# Patient Record
Sex: Female | Born: 1955 | Race: White | Hispanic: No | Marital: Married | Smoking: Current every day smoker
Health system: Southern US, Community
[De-identification: ages and names within clinical notes are randomized; demographics above are authoritative.]

## PROBLEM LIST (undated history)

## (undated) DIAGNOSIS — C499 Malignant neoplasm of connective and soft tissue, unspecified: Secondary | ICD-10-CM

## (undated) DIAGNOSIS — I10 Essential (primary) hypertension: Secondary | ICD-10-CM

---

## 2013-08-24 DIAGNOSIS — C499 Malignant neoplasm of connective and soft tissue, unspecified: Secondary | ICD-10-CM

## 2013-08-24 HISTORY — DX: Malignant neoplasm of connective and soft tissue, unspecified: C49.9

## 2014-08-24 HISTORY — PX: OTHER SURGICAL HISTORY: SHX169

## 2016-11-22 ENCOUNTER — Encounter (HOSPITAL_BASED_OUTPATIENT_CLINIC_OR_DEPARTMENT_OTHER): Payer: Self-pay | Admitting: *Deleted

## 2016-11-22 ENCOUNTER — Emergency Department (HOSPITAL_BASED_OUTPATIENT_CLINIC_OR_DEPARTMENT_OTHER)
Admission: EM | Admit: 2016-11-22 | Discharge: 2016-11-22 | Disposition: A | Discharge status: Home / Self Care | Payer: BC Managed Care – PPO | Attending: Emergency Medicine | Admitting: Emergency Medicine

## 2016-11-22 ENCOUNTER — Emergency Department (HOSPITAL_BASED_OUTPATIENT_CLINIC_OR_DEPARTMENT_OTHER): Payer: BC Managed Care – PPO

## 2016-11-22 DIAGNOSIS — S82401A Unspecified fracture of shaft of right fibula, initial encounter for closed fracture: Secondary | ICD-10-CM

## 2016-11-22 DIAGNOSIS — Y92009 Unspecified place in unspecified non-institutional (private) residence as the place of occurrence of the external cause: Secondary | ICD-10-CM | POA: Insufficient documentation

## 2016-11-22 DIAGNOSIS — S82201A Unspecified fracture of shaft of right tibia, initial encounter for closed fracture: Secondary | ICD-10-CM

## 2016-11-22 DIAGNOSIS — S82491A Other fracture of shaft of right fibula, initial encounter for closed fracture: Secondary | ICD-10-CM | POA: Insufficient documentation

## 2016-11-22 DIAGNOSIS — Y999 Unspecified external cause status: Secondary | ICD-10-CM | POA: Insufficient documentation

## 2016-11-22 DIAGNOSIS — S99911A Unspecified injury of right ankle, initial encounter: Secondary | ICD-10-CM | POA: Diagnosis present

## 2016-11-22 DIAGNOSIS — F17200 Nicotine dependence, unspecified, uncomplicated: Secondary | ICD-10-CM | POA: Diagnosis not present

## 2016-11-22 DIAGNOSIS — I10 Essential (primary) hypertension: Secondary | ICD-10-CM | POA: Insufficient documentation

## 2016-11-22 DIAGNOSIS — Y939 Activity, unspecified: Secondary | ICD-10-CM | POA: Diagnosis not present

## 2016-11-22 DIAGNOSIS — S82391A Other fracture of lower end of right tibia, initial encounter for closed fracture: Secondary | ICD-10-CM | POA: Insufficient documentation

## 2016-11-22 DIAGNOSIS — W1809XA Striking against other object with subsequent fall, initial encounter: Secondary | ICD-10-CM | POA: Diagnosis not present

## 2016-11-22 HISTORY — DX: Essential (primary) hypertension: I10

## 2016-11-22 HISTORY — DX: Malignant neoplasm of connective and soft tissue, unspecified: C49.9

## 2016-11-22 MED ORDER — HYDROMORPHONE HCL 1 MG/ML IJ SOLN
1.0000 mg | Freq: Once | INTRAMUSCULAR | Status: AC
  Administered 2016-11-22: 1 mg via INTRAMUSCULAR
  Filled 2016-11-22: qty 1

## 2016-11-22 MED ORDER — OXYCODONE-ACETAMINOPHEN 5-325 MG PO TABS: 1.0000 | ORAL_TABLET | ORAL | 0 refills | Status: AC | PRN

## 2016-11-22 NOTE — Discharge Instructions (Signed)
Keep your leg elevated above the level of your heart. Do not put any weight on your right leg. Get rechecked immediately if you develop significant worsening in your pain, numbness or increased pain and your foot.

## 2016-11-22 NOTE — ED Notes (Signed)
Call [facility] 

## 2016-11-22 NOTE — ED Provider Notes (Signed)
[facility] Provider Note   CSN: [id number] Arrival date & time: 11/22/16  1132     History   Chief Complaint Chief Complaint  Patient presents with  . Fall    HPI Ashley Salazar is a 61 y.o. female.  The history is provided by the patient. No language interpreter was used.  Fall     Ashley Salazar is a 61 y.o. female who presents to the Emergency Department complaining of leg pain.  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 She had a fall last night in her home when her leg buckled on her, which happens from time to time. She fell backwards, striking the back of her head lightly and landing on the floor. She reports severe pain to her right distal leg and ankle and has been unable to bear weight secondary to pain. She denies any headaches, vision changes, numbness, weakness, bowel pain, fevers, nausea, vomiting. 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 She does not take any blood thinners.  Past Medical History:  Diagnosis Date  . Hypertension   . Sarcoma ([place]) 2015   R leg    There are no active problems to display for this patient.   Past Surgical History:  Procedure Laterality Date  . OTHER SURGICAL HISTORY Right 2016   removal of femur, knee and quad muscles with rods placed d/t cancer    OB History    No data available       Home Medications    Prior to Admission medications   Medication Sig Start Date End Date Taking? Authorizing Provider  benazepril (LOTENSIN) 10 MG tablet Take 10 mg by mouth daily.   Yes Historical Provider, MD  METOPROLOL TARTRATE PO Take by mouth.   Yes Historical Provider, MD  SERTRALINE HCL PO Take by mouth.   Yes Historical Provider, MD  temazepam (RESTORIL) 30 MG capsule Take 30 mg by mouth at bedtime.   Yes Historical Provider, MD  oxyCODONE-acetaminophen (PERCOCET) 5-325 MG tablet Take 1 tablet by mouth  every 4 (four) hours as needed for severe pain. 11/22/16   Quintella Reichert, MD    Family History No family history on file.  Social History Social History  Substance Use Topics  . Smoking status: Current Every Day Smoker  . Smokeless tobacco: Never Used  . Alcohol use Yes     Comment: daily     Allergies   Patient has no known allergies.   Review of Systems Review of Systems  All other systems reviewed and are negative.    Physical Exam Updated Vital Signs BP (!) 163/80 (BP Location: Right Arm)   Pulse 83   Temp 97.6 F (36.4 C) (Oral)   Resp (!) 22   Ht 5\' 2"  (1.575 m)   Wt 145 lb (65.8 kg)   SpO2 100%   BMI 26.52 kg/m   Physical Exam  Constitutional: She is oriented to person, place, and time. She appears well-developed and well-nourished.  HENT:  Head: Normocephalic and atraumatic.  Cardiovascular: Normal rate and regular rhythm.   No murmur heard. Pulmonary/Chest: Effort normal and breath sounds normal. No respiratory distress.  Abdominal: Soft. There is no tenderness. There is no rebound and no guarding.  Musculoskeletal:  2+ DP pulses bilaterally. There is swelling and ecchymosis and tenderness to the right mid and distal leg with significant tenderness over bilateral  malleoli, right greater than left. There is no tenderness over the right knee or hip. There are surgical changes to the right thigh and knee. There is atrophy to the right thigh.  Neurological: She is alert and oriented to person, place, and time.  Sensation to light touch intact in bilateral lower extremities. Wiggles toes without difficulty. Strength testing is limited secondary to baseline deficit of absent quadriceps muscles as well as patient's pain.  Skin: Skin is warm and dry.  Psychiatric: She has a normal mood and affect. Her behavior is normal.  Nursing note and vitals reviewed.    ED Treatments / Results  Labs (all labs ordered are listed, but only abnormal results are  displayed) Labs Reviewed - No data to display  EKG  EKG Interpretation None       Radiology Dg Tibia/fibula Right  Result Date: 11/22/2016 CLINICAL DATA:  Status post fall.  Distal right lower leg pain. EXAM: RIGHT TIBIA AND FIBULA - 2 VIEW COMPARISON:  None. FINDINGS: Severe osteopenia. 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 Oblique fracture of the distal tibial diaphysis extending into the metaphysis with 7 mm of lateral displacement. Oblique fracture of the distal fibular diaphysis without significant displacement and minimal apex lateral angulation. Ankle mortise is intact. Soft tissue swelling around the distal right lower leg. IMPRESSION: Oblique fracture of the distal tibial diaphysis extending into the metaphysis with 7 mm of lateral displacement. Oblique fracture of the distal fibular diaphysis without significant displacement and minimal apex lateral angulation. Electronically Signed   By: Kathreen Devoid   On: 11/22/2016 12:54   Dg Ankle Complete Right  Result Date: 11/22/2016 CLINICAL DATA:  Status post fall.  Right lower leg pain. EXAM: RIGHT ANKLE - COMPLETE 3+ VIEW COMPARISON:  None. FINDINGS: Severe osteopenia. Oblique fracture of the distal tibial diaphysis extending into the metaphysis with 7 mm of lateral displacement. Oblique fracture of the distal fibular diaphysis without significant displacement and minimal apex lateral angulation. Ankle mortise is intact. Soft tissue swelling around the distal right lower leg. IMPRESSION: Oblique fracture of the distal tibial diaphysis extending into the metaphysis with 7 mm of lateral displacement. Oblique fracture of the distal fibular diaphysis without significant displacement and minimal apex lateral angulation. Electronically Signed   By: Kathreen Devoid   On: 11/22/2016 12:54    Procedures Procedures (including critical care time) SPLINT APPLICATION Date/Time: 3:53 PM Authorized by: Quintella Reichert Consent: Verbal  consent obtained. Risks and benefits: risks, benefits and alternatives were discussed Consent given by: patient Splint applied by: orthopedic technician Location details: RLE Splint type: posterior stirrup Supplies used: fiberglass, cotton batting Post-procedure: The splinted body part was neurovascularly unchanged following the procedure. Patient tolerance: Patient tolerated the procedure well with no immediate complications.    Medications Ordered in ED Medications  HYDROmorphone (DILAUDID) injection 1 mg (1 mg Intramuscular Given 11/22/16 1202)     Initial Impression / Assessment and Plan / ED Course  I have reviewed the triage vital signs and the nursing notes.  Pertinent labs & imaging results that were available during my care of the patient were reviewed by me and considered in my medical decision making (see chart for details).     Patient here for evaluation of injuries following a fall last night. She is neurovascularly intact on examination with swelling and tenderness to her distal leg. Imaging demonstrates a closed tib-fib fracture. Her pain was treated with Dilaudid in the emergency department. She was placed in a posterior  splint for immobilization. Discussed with Dr. Prudy Feeler, on-call physician for her orthopedic surgeon ,Dr. Redmond Pulling  at [facility]. Plan to discharge home with splint for immobilization with close evaluation in the office the next 24-48 hours. Counseled patient on home care of rest, elevation, pain control. Discussed very close return precautions for any evidence of worsening pain or developing compartment syndrome. Patient has a wheelchair available at home. We will provide crutches.  Final Clinical Impressions(s) / ED Diagnoses   Final diagnoses:  Closed fracture of right tibia and fibula, initial encounter    New Prescriptions New Prescriptions   OXYCODONE-ACETAMINOPHEN (PERCOCET) 5-325 MG TABLET    Take 1 tablet by mouth  every 4 (four) hours as needed for severe pain.     Quintella Reichert, MD 11/22/16 [phone]

## 2016-11-22 NOTE — ED Notes (Signed)
Pt teaching provided on medications that may cause drowsiness. Pt instructed not to drive or operate heavy machinery while taking the prescribed medication. Pt verbalized understanding.   

## 2016-11-22 NOTE — ED Triage Notes (Signed)
Pt reports she fell last night d/t her R leg giving out -- reports hardware in R leg d/t removal of bone and muscle d/t cancer (2016). Reports hitting the back of her head against the wall -- denies LOC, n/v. Denies other known injuries. Presents today with R leg pain.

## 2016-11-22 NOTE — ED Notes (Signed)
ED Provider at bedside. 

## 2016-11-22 NOTE — ED Notes (Signed)
Patient transported to X-ray 

## 2017-09-09 IMAGING — DX DG TIBIA/FIBULA 2V*R*
4 series · 4 of 4 positions shown · non-contrast
Comparison: None.

CLINICAL DATA: Status post fall.  Distal right lower leg pain.

EXAM:
RIGHT TIBIA AND FIBULA - 2 VIEW

[tibia ap (1 of 2)]
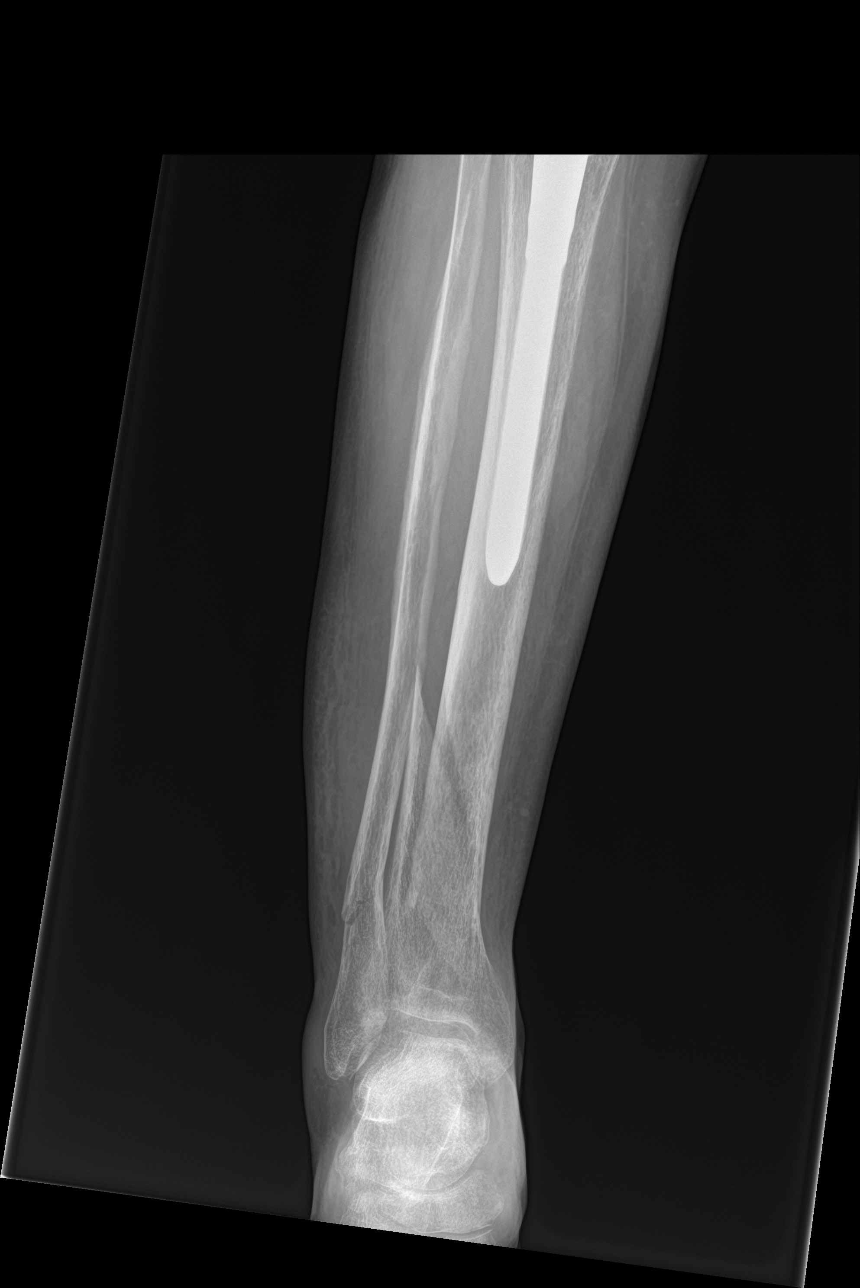

[tibia ap (2 of 2)]
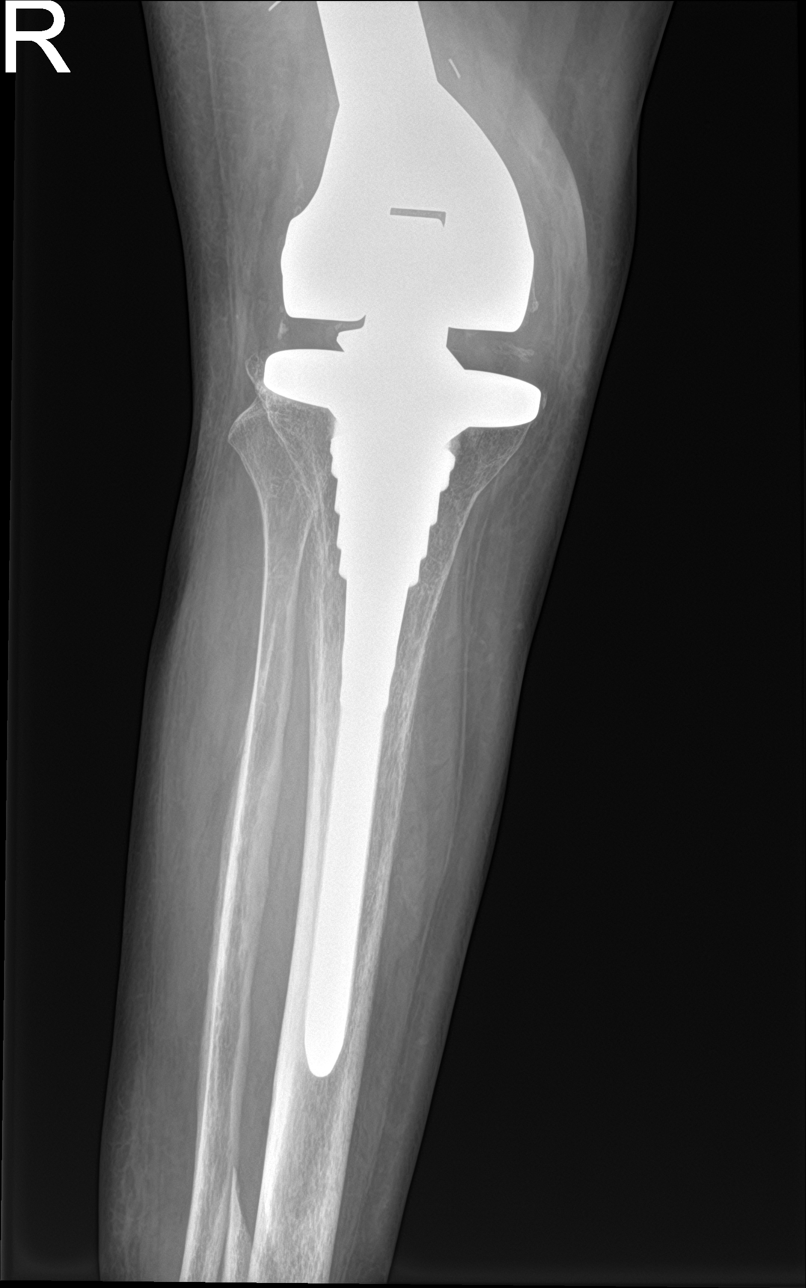

[tibia lat (1 of 2)]
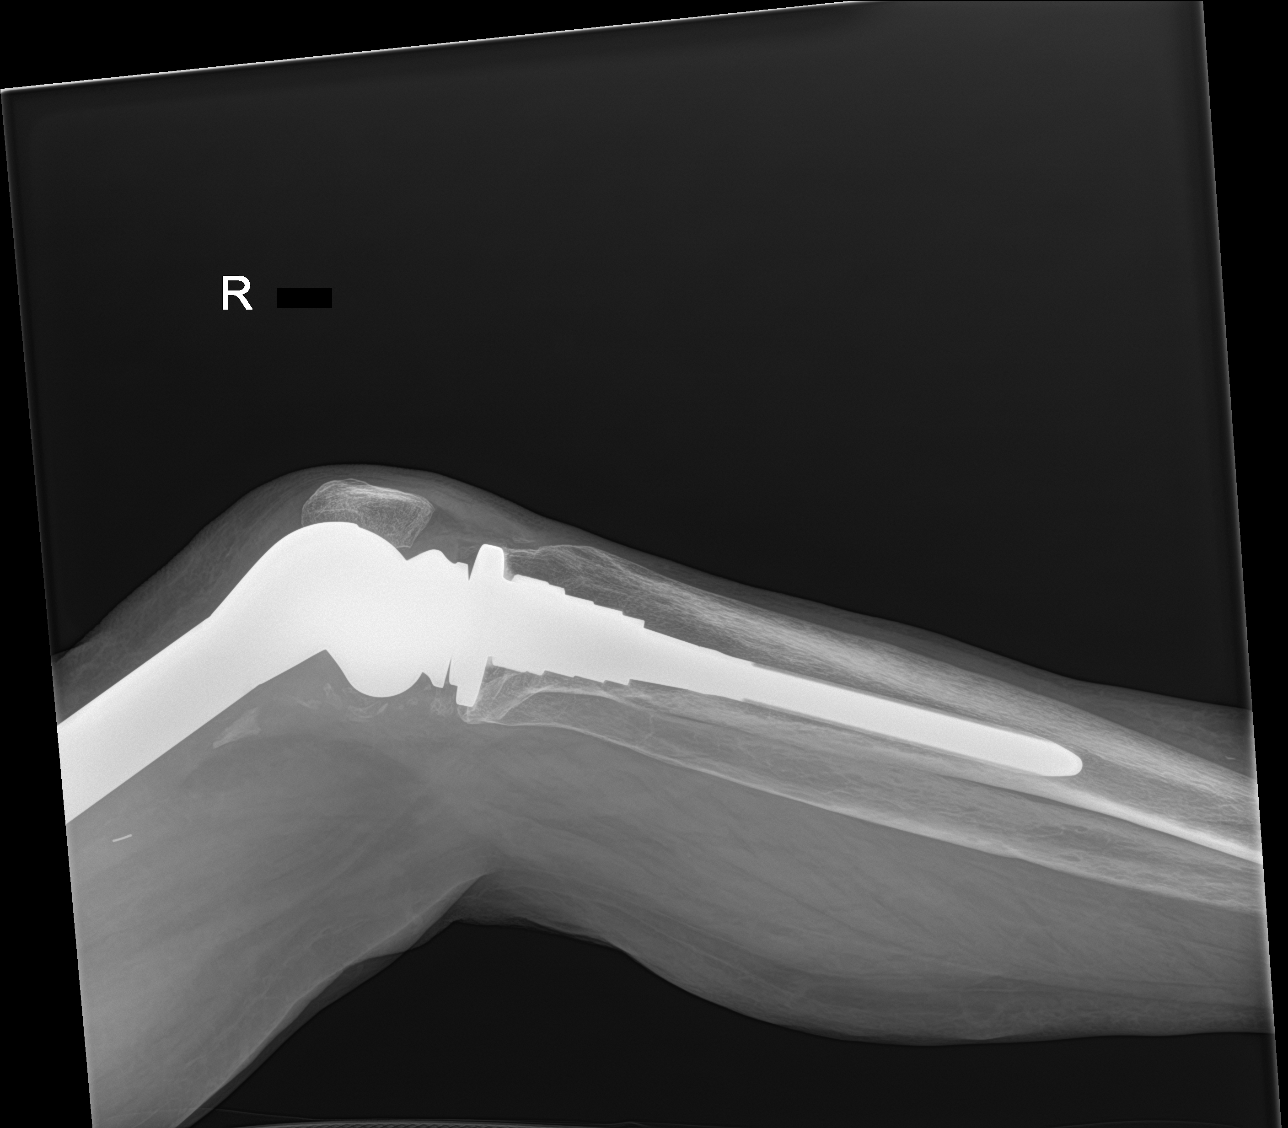

[tibia lat (2 of 2)]
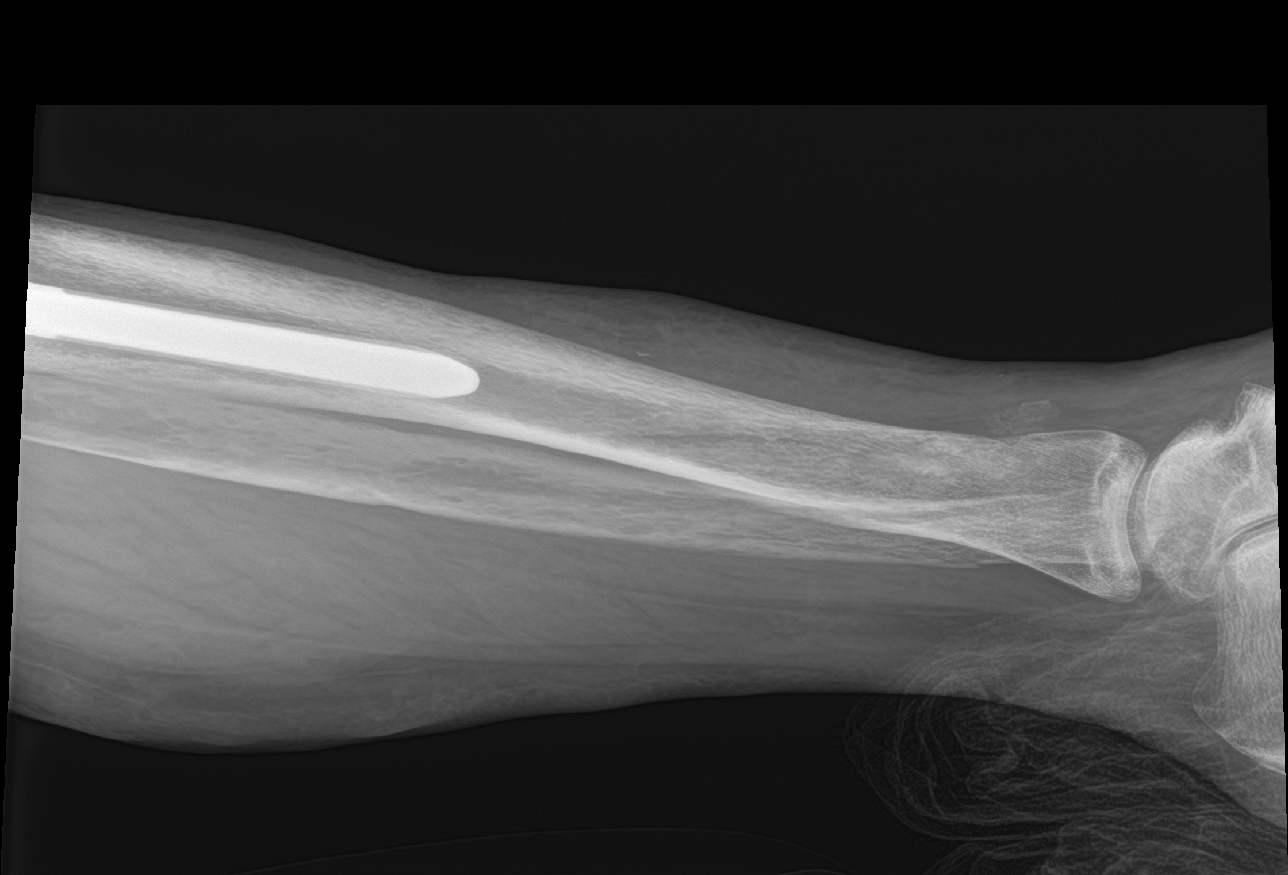

[4 of 4 positions shown; findings below may reference images not displayed]

FINDINGS: Severe osteopenia.

Partially visualize long stem right total knee arthroplasty.

Oblique fracture of the distal tibial diaphysis extending into the
metaphysis with 7 mm of lateral displacement.

Oblique fracture of the distal fibular diaphysis without significant
displacement and minimal apex lateral angulation.

Ankle mortise is intact.

Soft tissue swelling around the distal right lower leg.
IMPRESSION: Oblique fracture of the distal tibial diaphysis extending into the
metaphysis with 7 mm of lateral displacement.

Oblique fracture of the distal fibular diaphysis without significant
displacement and minimal apex lateral angulation.

## 2017-09-09 IMAGING — DX DG ANKLE COMPLETE 3+V*R*
3 series · 3 of 3 positions shown · non-contrast
Comparison: None.

CLINICAL DATA: Status post fall.  Right lower leg pain.

EXAM:
RIGHT ANKLE - COMPLETE 3+ VIEW

[ankle ap]
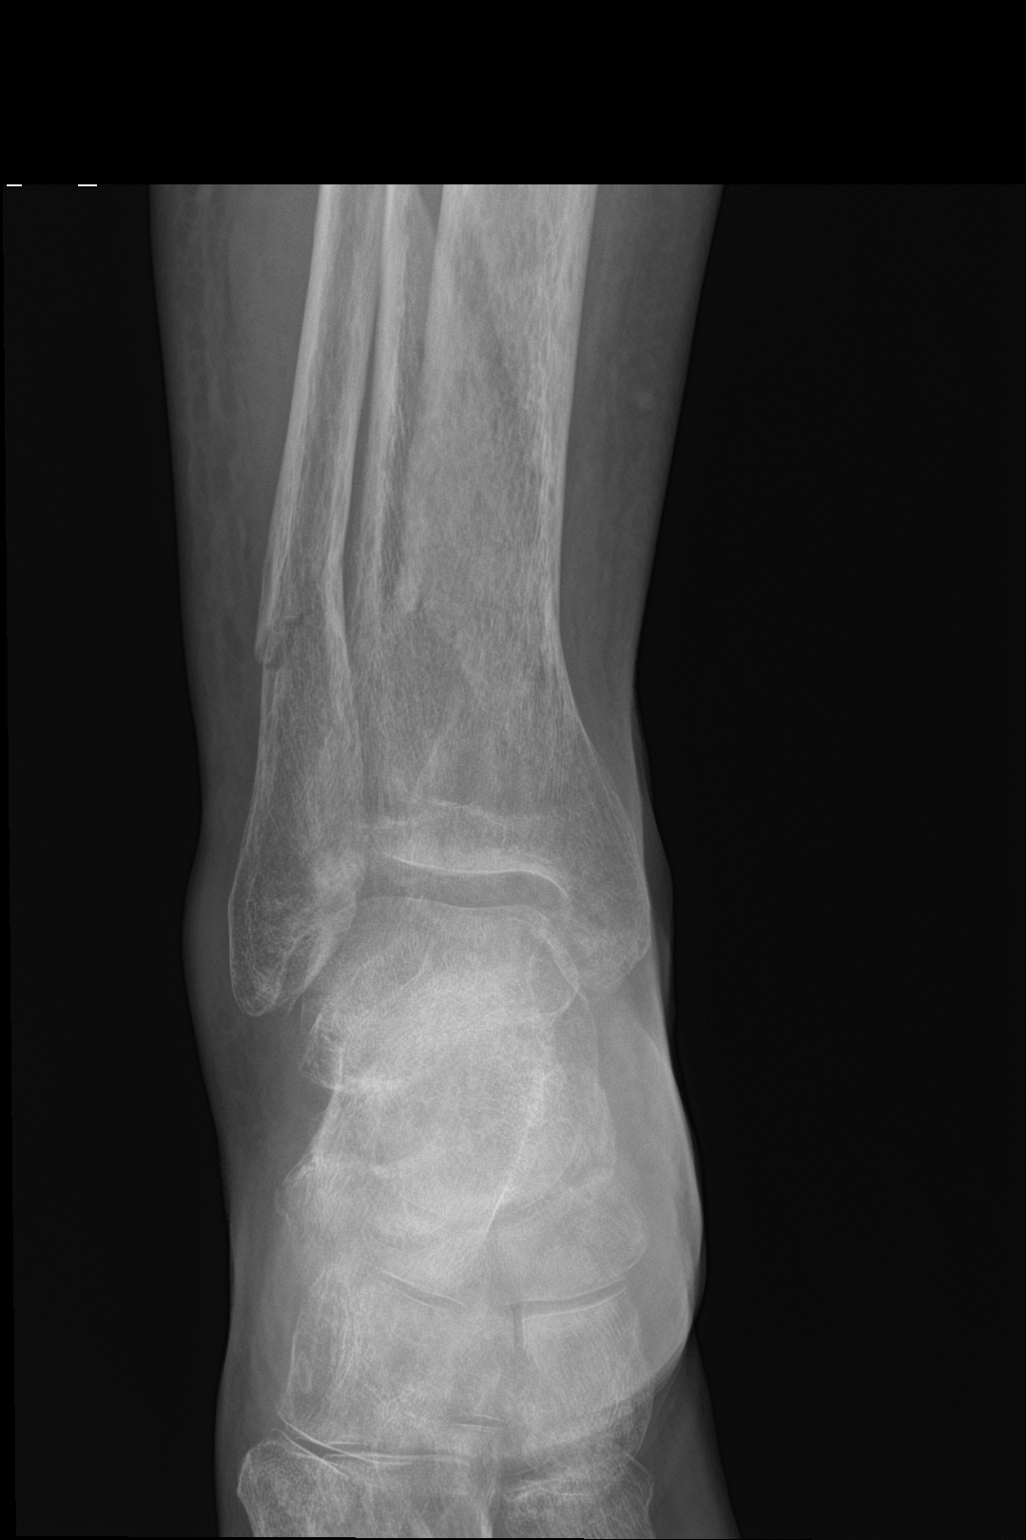

[ankle obl]
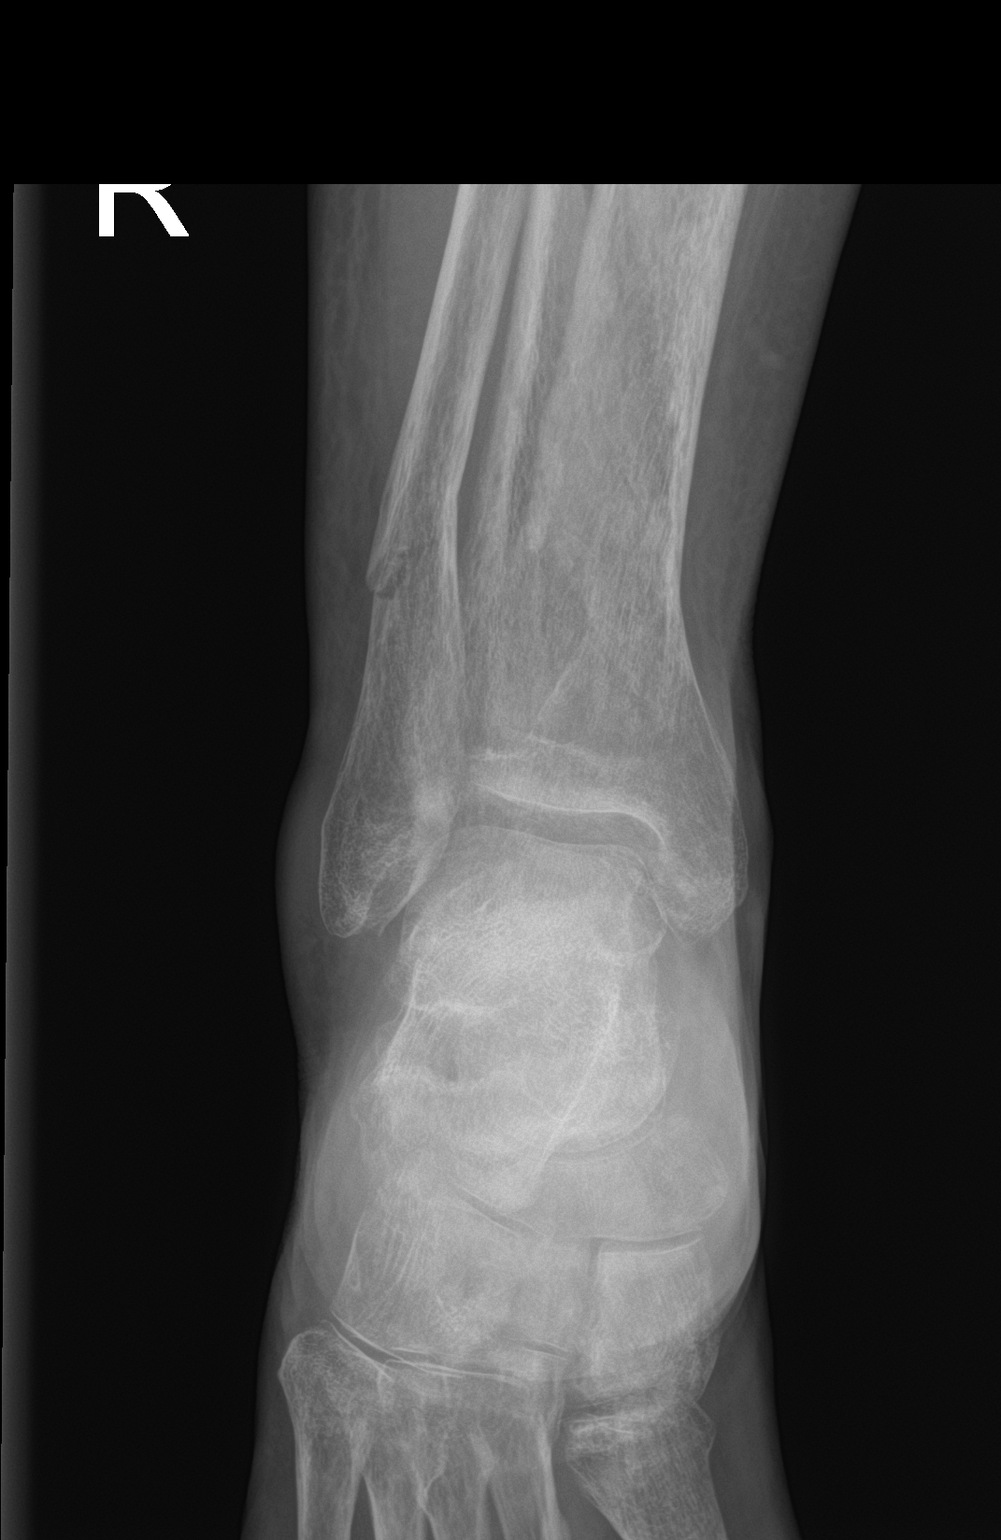

[ankle lat]
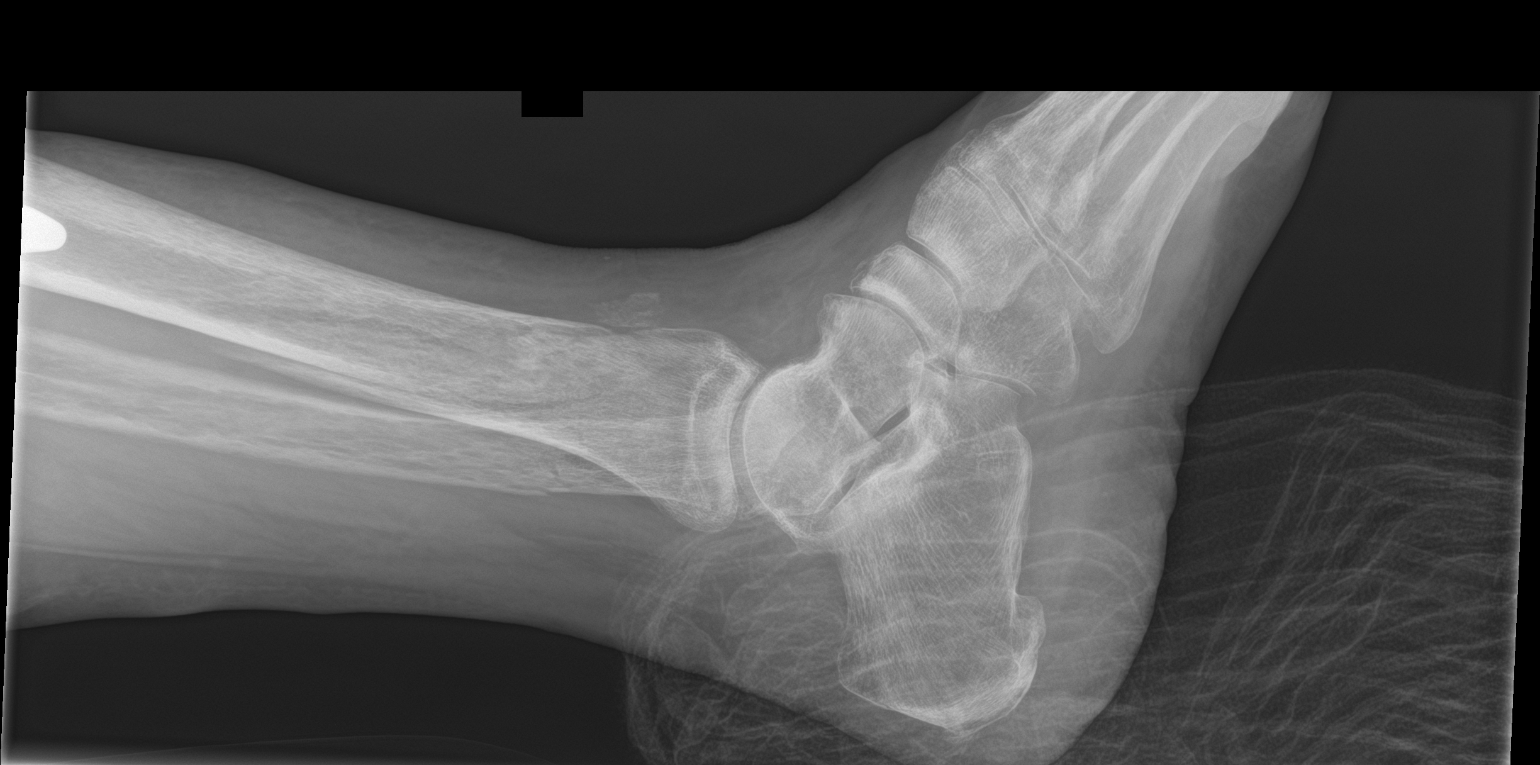

[3 of 3 positions shown; findings below may reference images not displayed]

FINDINGS: Severe osteopenia.

Oblique fracture of the distal tibial diaphysis extending into the
metaphysis with 7 mm of lateral displacement.

Oblique fracture of the distal fibular diaphysis without significant
displacement and minimal apex lateral angulation.

Ankle mortise is intact.

Soft tissue swelling around the distal right lower leg.
IMPRESSION: Oblique fracture of the distal tibial diaphysis extending into the
metaphysis with 7 mm of lateral displacement.

Oblique fracture of the distal fibular diaphysis without significant
displacement and minimal apex lateral angulation.

## 2021-03-24 DEATH — deceased
